# Patient Record
Sex: Male | Born: 1980 | Race: Black or African American | Hispanic: No | Marital: Single | Smoking: Current every day smoker
Health system: Southern US, Community
[De-identification: ages and names within clinical notes are randomized; demographics above are authoritative.]

## PROBLEM LIST (undated history)

## (undated) DIAGNOSIS — G43909 Migraine, unspecified, not intractable, without status migrainosus: Secondary | ICD-10-CM

---

## 2005-02-12 ENCOUNTER — Emergency Department (HOSPITAL_COMMUNITY): Admission: EM | Admit: 2005-02-12 | Discharge: 2005-02-12 | Payer: Self-pay | Admitting: Emergency Medicine

## 2005-03-03 ENCOUNTER — Emergency Department (HOSPITAL_COMMUNITY): Admission: EM | Admit: 2005-03-03 | Discharge: 2005-03-03 | Payer: Self-pay | Admitting: Emergency Medicine

## 2006-08-06 ENCOUNTER — Emergency Department (HOSPITAL_COMMUNITY): Admission: EM | Admit: 2006-08-06 | Discharge: 2006-08-06 | Payer: Self-pay | Admitting: Family Medicine

## 2006-10-23 ENCOUNTER — Emergency Department (HOSPITAL_COMMUNITY): Admission: EM | Admit: 2006-10-23 | Discharge: 2006-10-23 | Payer: Self-pay | Admitting: Emergency Medicine

## 2007-10-22 ENCOUNTER — Emergency Department (HOSPITAL_COMMUNITY): Admission: EM | Admit: 2007-10-22 | Discharge: 2007-10-22 | Payer: Self-pay | Admitting: Emergency Medicine
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
## 2018-10-03 ENCOUNTER — Other Ambulatory Visit: Payer: Self-pay

## 2018-10-03 ENCOUNTER — Ambulatory Visit (HOSPITAL_COMMUNITY)
Admission: EM | Admit: 2018-10-03 | Discharge: 2018-10-03 | Disposition: A | Discharge status: Home / Self Care | Payer: Self-pay | Attending: Family Medicine | Admitting: Family Medicine

## 2018-10-03 ENCOUNTER — Encounter (HOSPITAL_COMMUNITY): Payer: Self-pay

## 2018-10-03 DIAGNOSIS — Z7189 Other specified counseling: Secondary | ICD-10-CM

## 2018-10-03 DIAGNOSIS — Z0289 Encounter for other administrative examinations: Secondary | ICD-10-CM

## 2018-10-03 NOTE — ED Triage Notes (Signed)
Pt would like a work note. 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 Pt denies any symptoms and would like a work note.

## 2018-10-03 NOTE — ED Provider Notes (Signed)
[facility]    CSN: [id number] Arrival date & time: 10/03/18  [id number]     History   Chief Complaint Chief Complaint  Patient presents with  . Work note    HPI Nathaniel Bell is a 38 y.o. male.   HPI Patient is here for a physical examination and clearance to work. He states that he works at [employer] company where many of his coworkers are friends.  Over the weekend he had his friends got together for a "get together".  He states "we tried to social distance".  On Monday the house to the party became sick and was unable to go to work.  When she reported her symptoms to her employer, and who she was exposed to, the employer made everyone who went to the party go home.  This gentleman states that he feels well.  At no point has he had cough cold runny nose sore throat or fever.  He states that every day he goes to work they check his temperature, and he wears a mask throughout his shift History reviewed. No pertinent past medical history.  There are no active problems to display for this patient.   History reviewed. No pertinent surgical history.     Home Medications    Prior to Admission medications   Not on File    Family History History reviewed. No pertinent family history.  Social History Social History   Tobacco Use  . Smoking status: Current Every Day Smoker  . Smokeless tobacco: Current User  Substance Use Topics  . Alcohol use: Not Currently  . Drug use: Not Currently     Allergies   Patient has no known allergies.   Review of Systems Review of Systems  Constitutional: Negative for chills and fever.  HENT: Negative for ear pain and sore throat.   Eyes: Negative for pain and visual disturbance.  Respiratory: Negative for cough and shortness of breath.   Cardiovascular: Negative for chest pain and palpitations.  Gastrointestinal: Negative for abdominal pain and vomiting.  Genitourinary: Negative for dysuria and hematuria.  Musculoskeletal: Negative  for arthralgias and back pain.  Skin: Negative for color change and rash.  Neurological: Negative for seizures and syncope.  All other systems reviewed and are negative.    Physical Exam Triage Vital Signs ED Triage Vitals  Enc Vitals Group     BP 10/03/18 0939 129/76     Pulse Rate 10/03/18 0939 70     Resp 10/03/18 0939 18     Temp 10/03/18 0939 98.4 F (36.9 C)     Temp Source 10/03/18 0939 Oral     SpO2 10/03/18 0939 100 %   No data found.  Updated Vital Signs BP 129/76 (BP Location: Left Arm)   Pulse 70   Temp 98.4 F (36.9 C) (Oral)   Resp 18   SpO2 100%      Physical Exam Constitutional:      General: He is not in acute distress.    Appearance: He is well-developed.  HENT:     Head: Normocephalic and atraumatic.     Right Ear: Tympanic membrane, ear canal and external ear normal.     Left Ear: Tympanic membrane, ear canal and external ear normal.     Nose: Nose normal. No congestion.     Mouth/Throat:     Mouth: Mucous membranes are moist.     Pharynx: No posterior oropharyngeal erythema.  Eyes:     Conjunctiva/sclera: Conjunctivae normal.  Pupils: Pupils are equal, round, and reactive to light.  Neck:     Musculoskeletal: Normal range of motion.  Cardiovascular:     Rate and Rhythm: Normal rate and regular rhythm.     Heart sounds: Normal heart sounds.  Pulmonary:     Effort: Pulmonary effort is normal. No respiratory distress.     Breath sounds: Normal breath sounds.  Abdominal:     General: Abdomen is flat. There is no distension.     Palpations: Abdomen is soft.     Tenderness: There is no abdominal tenderness.  Musculoskeletal: Normal range of motion.  Skin:    General: Skin is warm and dry.  Neurological:     General: No focal deficit present.     Mental Status: He is alert.  Psychiatric:        Mood and Affect: Mood normal.        Behavior: Behavior normal.      UC Treatments / Results  Labs (all labs ordered are listed, but  only abnormal results are displayed) Labs Reviewed - No data to display  EKG None  Radiology No results found.  Procedures Procedures (including critical care time)  Medications Ordered in UC Medications - No data to display  Initial Impression / Assessment and Plan / UC Course  I have reviewed the triage vital signs and the nursing notes.  Pertinent labs & imaging results that were available during my care of the patient were reviewed by me and considered in my medical decision making (see chart for details).     We discussed that get-togethers and parties not advisable at all during the Covid 19 pandemic.  This is the reason he was sent home from work.  We discussed that he needs to check his temperature daily and watch carefully for symptoms.  At the first sign of any illness he needs to be released from work.  If he thinks he has any sign of call that he needs to call the hotline instead of coming into a physician. Final Clinical Impressions(s) / UC Diagnoses   Final diagnoses:  Advice Given About Covid-19 Virus Infection  Encounter to obtain excuse from work     Discharge Instructions     Check temperature daily Watch for signs of infection, cough, fever  Call if you think you have any sign of COVID [place] COVID19 hotline : [phone]      ED Prescriptions    None     Controlled Substance Prescriptions [place] Controlled Substance Registry consulted? Not Applicable   Eustace Moore, MD 10/03/18 1019

## 2018-10-03 NOTE — Discharge Instructions (Addendum)
Check temperature daily Watch for signs of infection, cough, fever  Call if you think you have any sign of COVID [place] COVID19 hotline : [phone]

## 2021-11-20 ENCOUNTER — Other Ambulatory Visit: Payer: Self-pay

## 2021-11-20 ENCOUNTER — Ambulatory Visit (HOSPITAL_COMMUNITY)
Admission: EM | Admit: 2021-11-20 | Discharge: 2021-11-20 | Disposition: A | Discharge status: Home / Self Care | Payer: Self-pay | Attending: Emergency Medicine | Admitting: Emergency Medicine

## 2021-11-20 ENCOUNTER — Encounter (HOSPITAL_COMMUNITY): Payer: Self-pay | Admitting: *Deleted

## 2021-11-20 DIAGNOSIS — Z711 Person with feared health complaint in whom no diagnosis is made: Secondary | ICD-10-CM | POA: Insufficient documentation

## 2021-11-20 DIAGNOSIS — Z202 Contact with and (suspected) exposure to infections with a predominantly sexual mode of transmission: Secondary | ICD-10-CM

## 2021-11-20 NOTE — Discharge Instructions (Addendum)
Since you are asymptomatic we will wait for the test result return you will be called with the results in a few days.  Return sooner if worse or new symptoms for reevaluation.

## 2021-11-20 NOTE — ED Provider Notes (Signed)
  [facility]   MRN: [id number] DOB: 1980-09-03  Subjective:   Chief Complaint;  Chief Complaint  Patient presents with   Exposure to STD    Toan Mort is a 41 y.o. male presenting for concern of possible STD.  Patient had unprotected intercourse with a male this past weekend.  He denies that she complained of symptoms and patient is also asymptomatic but is concerned enough to want testing.  No current facility-administered medications for this encounter. No current outpatient medications on file.   No Known Allergies  History reviewed. No pertinent past medical history.   Review of Systems  All other systems reviewed and are negative.   Objective:   Vitals: BP 123/78   Pulse 68   Temp 98.6 F (37 C)   Resp 18   SpO2 98%   Physical Exam Vitals and nursing note reviewed.  Constitutional:      General: He is not in acute distress.    Appearance: He is well-developed.  HENT:     Head: Normocephalic and atraumatic.  Eyes:     Conjunctiva/sclera: Conjunctivae normal.  Cardiovascular:     Rate and Rhythm: Normal rate and regular rhythm.     Heart sounds: No murmur heard. Pulmonary:     Effort: Pulmonary effort is normal. No respiratory distress.     Breath sounds: Normal breath sounds.  Abdominal:     Palpations: Abdomen is soft.     Tenderness: There is no abdominal tenderness.  Musculoskeletal:        General: No swelling.     Cervical back: Neck supple.  Skin:    General: Skin is warm and dry.     Capillary Refill: Capillary refill takes less than 2 seconds.  Neurological:     Mental Status: He is alert.  Psychiatric:        Mood and Affect: Mood normal.    No results found for this or any previous visit (from the past 24 hour(s)).  No results found.     Assessment and Plan :   1. Concern about STD in male without diagnosis       MDM:  Colbi Staubs is a 41 y.o. male presenting for testing for STDs urethral swabs done for  chlamydia, gonorrhea and trichomoniasis.  Patient assured he will be called with the results.  I discussed treatment, follow up and return instructions. Questions were answered. Patient/representative stated understanding of instructions and patient is stable for discharge.  Dewaine Conger FNP-C MCN    Jone Baseman, NP 11/20/21 1109

## 2021-11-20 NOTE — ED Triage Notes (Signed)
T reports he received a call from sexual partner that she was Positive for STD . Pt has no Sx's

## 2021-11-22 LAB — CYTOLOGY, (ORAL, ANAL, URETHRAL) ANCILLARY ONLY
Chlamydia: NEGATIVE
Comment: NEGATIVE
Comment: NEGATIVE
Comment: NORMAL
Neisseria Gonorrhea: NEGATIVE
Trichomonas: POSITIVE — AB

## 2021-11-23 ENCOUNTER — Telehealth (HOSPITAL_COMMUNITY): Payer: Self-pay | Admitting: Emergency Medicine

## 2021-11-23 MED ORDER — METRONIDAZOLE 500 MG PO TABS: 2000.0000 mg | ORAL_TABLET | Freq: Once | ORAL | 0 refills | Status: AC

## 2022-06-11 ENCOUNTER — Telehealth: Payer: Self-pay | Admitting: Family

## 2022-06-11 DIAGNOSIS — R369 Urethral discharge, unspecified: Secondary | ICD-10-CM

## 2022-06-11 NOTE — Progress Notes (Signed)
Given your symptoms, I feel your condition warrants further evaluation and I recommend that you be seen in a face to face visit.   NOTE: There will be NO CHARGE for this eVisit   If you are having a true medical emergency please call 911.      For an urgent face to face visit, [facility] has seven urgent care centers for your convenience:     [facility] at [facility] Directions [phone] [address] [place], [place] [address]    [facility]) Get Driving Directions [phone] [address] [place], [place] [address]  [facility]) Get Driving Directions [phone] [address] [place],  [place]  [address]  [facility] - at [facility] Directions  [phone] [address] [place],  [place] [address]   [facility] at [facility] Get Driving Directions [phone] [address] [place], [place] [address]   [facility] at [facility] -  Get Driving Directions  [phone] [address] [place], [place] [address]   [facility] at [facility] Directions [phone] [address] [place], [place] [address]  Your MyChart E-visit questionnaire answers were reviewed by a board certified advanced clinical practitioner to complete your personal care plan based on your specific symptoms.  Thank you for using e-Visits.

## 2023-07-24 ENCOUNTER — Ambulatory Visit (HOSPITAL_COMMUNITY)
Admission: EM | Admit: 2023-07-24 | Discharge: 2023-07-24 | Disposition: A | Discharge status: Home / Self Care | Payer: BC Managed Care – PPO | Attending: Emergency Medicine | Admitting: Emergency Medicine

## 2023-07-24 ENCOUNTER — Encounter (HOSPITAL_COMMUNITY): Payer: Self-pay

## 2023-07-24 DIAGNOSIS — R519 Headache, unspecified: Secondary | ICD-10-CM | POA: Diagnosis not present

## 2023-07-24 HISTORY — DX: Migraine, unspecified, not intractable, without status migrainosus: G43.909

## 2023-07-24 MED ORDER — DEXAMETHASONE SODIUM PHOSPHATE 10 MG/ML IJ SOLN
INTRAMUSCULAR | Status: AC
  Filled 2023-07-24: qty 1

## 2023-07-24 MED ORDER — ONDANSETRON 4 MG PO TBDP
ORAL_TABLET | ORAL | Status: AC
  Filled 2023-07-24: qty 1

## 2023-07-24 MED ORDER — KETOROLAC TROMETHAMINE 30 MG/ML IJ SOLN
30.0000 mg | Freq: Once | INTRAMUSCULAR | Status: AC
  Administered 2023-07-24: 30 mg via INTRAMUSCULAR

## 2023-07-24 MED ORDER — DEXAMETHASONE SODIUM PHOSPHATE 10 MG/ML IJ SOLN
10.0000 mg | Freq: Once | INTRAMUSCULAR | Status: AC
  Administered 2023-07-24: 10 mg via INTRAMUSCULAR

## 2023-07-24 MED ORDER — KETOROLAC TROMETHAMINE 30 MG/ML IJ SOLN
INTRAMUSCULAR | Status: AC
  Filled 2023-07-24: qty 1

## 2023-07-24 MED ORDER — ONDANSETRON 4 MG PO TBDP
4.0000 mg | ORAL_TABLET | Freq: Once | ORAL | Status: AC
  Administered 2023-07-24: 4 mg via ORAL

## 2023-07-24 NOTE — Discharge Instructions (Addendum)
You were given a series of 3 medications today to help with your migraine. Alternate between Tylenol and Ibuprofen every 4-6 hours as needed for headache.Make sure to stay hydrated and get some rest. If your headache persists or worsens, you develop severe vomiting, blurred vision, numbness, weakness, slurred speech, or confusion please seek immediate medical treatment in the ER.

## 2023-07-24 NOTE — ED Triage Notes (Signed)
Pt states frontal headache for the past 2 days.  States he is taking tylenol and goody powder at home with some relief.

## 2023-07-24 NOTE — ED Provider Notes (Signed)
[facility]    CSN: [id number] Arrival date & time: 07/24/23  1159      History   Chief Complaint Chief Complaint  Patient presents with   Headache    HPI Nathaniel Bell is a 43 y.o. male.   Patient presents with headache with mild nausea x3 days. Patient also endorses photophobia. Denies blurred vision, vomiting, weakness, numbness, confusion, and slurred speech. Reports taking goody powder and Tylenol with minimal relief.     Headache Associated symptoms: nausea and photophobia   Associated symptoms: no dizziness, no fever, no numbness, no vomiting and no weakness     Past Medical History:  Diagnosis Date   Migraines     There are no active problems to display for this patient.   History reviewed. No pertinent surgical history.     Home Medications    Prior to Admission medications   Not on File    Family History History reviewed. No pertinent family history.  Social History Social History   Tobacco Use   Smoking status: Every Day   Smokeless tobacco: Current  Substance Use Topics   Alcohol use: Not Currently   Drug use: Not Currently     Allergies   Patient has no known allergies.   Review of Systems Review of Systems  Constitutional:  Negative for fever.  Eyes:  Positive for photophobia. Negative for visual disturbance.  Gastrointestinal:  Positive for nausea. Negative for vomiting.  Neurological:  Positive for headaches. Negative for dizziness, facial asymmetry, speech difficulty, weakness, light-headedness and numbness.     Physical Exam Triage Vital Signs ED Triage Vitals  Encounter Vitals Group     BP 07/24/23 1329 137/74     Systolic BP Percentile --      Diastolic BP Percentile --      Pulse Rate 07/24/23 1329 98     Resp 07/24/23 1329 18     Temp 07/24/23 1329 97.7 F (36.5 C)     Temp Source 07/24/23 1329 Oral     SpO2 07/24/23 1329 97 %     Weight 07/24/23 1330 160 lb (72.6 kg)     Height 07/24/23 1330 6\' 1"   (1.854 m)     Head Circumference --      Peak Flow --      Pain Score 07/24/23 1329 10     Pain Loc --      Pain Education --      Exclude from Growth Chart --    No data found.  Updated Vital Signs BP 137/74 (BP Location: Left Arm)   Pulse 98   Temp 97.7 F (36.5 C) (Oral)   Resp 18   Ht 6\' 1"  (1.854 m)   Wt 160 lb (72.6 kg)   SpO2 97%   BMI 21.11 kg/m   Visual Acuity Right Eye Distance:   Left Eye Distance:   Bilateral Distance:    Right Eye Near:   Left Eye Near:    Bilateral Near:     Physical Exam Vitals and nursing note reviewed.  Constitutional:      General: He is awake. He is not in acute distress.    Appearance: Normal appearance. He is well-developed and well-groomed. He is not ill-appearing.  HENT:     Head: Normocephalic.  Eyes:     Extraocular Movements: Extraocular movements intact.     Pupils: Pupils are equal, round, and reactive to light.  Cardiovascular:     Rate and Rhythm: Normal rate  and regular rhythm.  Pulmonary:     Effort: Pulmonary effort is normal.     Breath sounds: Normal breath sounds.  Musculoskeletal:        General: Normal range of motion.     Cervical back: Normal range of motion and neck supple.  Skin:    General: Skin is warm and dry.  Neurological:     Mental Status: He is alert and oriented to person, place, and time. Mental status is at baseline.     GCS: GCS eye subscore is 4. GCS verbal subscore is 5. GCS motor subscore is 6.     Cranial Nerves: Cranial nerves 2-12 are intact.     Sensory: Sensation is intact.     Motor: Motor function is intact.     Coordination: Coordination is intact.     Gait: Gait is intact.  Psychiatric:        Attention and Perception: Attention and perception normal.        Mood and Affect: Mood and affect normal.        Speech: Speech normal.        Behavior: Behavior normal. Behavior is cooperative.        Thought Content: Thought content normal.        Cognition and Memory: Cognition  and memory normal.        Judgment: Judgment normal.      UC Treatments / Results  Labs (all labs ordered are listed, but only abnormal results are displayed) Labs Reviewed - No data to display  EKG   Radiology No results found.  Procedures Procedures (including critical care time)  Medications Ordered in UC Medications  ketorolac (TORADOL) 30 MG/ML injection 30 mg (has no administration in time range)  ondansetron (ZOFRAN-ODT) disintegrating tablet 4 mg (has no administration in time range)  dexamethasone (DECADRON) injection 10 mg (has no administration in time range)    Initial Impression / Assessment and Plan / UC Course  I have reviewed the triage vital signs and the nursing notes.  Pertinent labs & imaging results that were available during my care of the patient were reviewed by me and considered in my medical decision making (see chart for details).     Patient presented with 3-day history of headache with mild nausea and photophobia. Denies any other symptoms. Unrelieved with goody powder and Tylenol.   Patient rates headache 10 out of 10 on pain scale. No significant findings upon exam. No neurodeficits noted. EOMI and PERRLA. GCS 15.  Given IM Toradol, IM Decadron, and Zofran ODT in clinic. Discussed follow-up, return, strict ER precautions.  Final Clinical Impressions(s) / UC Diagnoses   Final diagnoses:  Bad headache     Discharge Instructions      You were given a series of 3 medications today to help with your migraine. Alternate between Tylenol and Ibuprofen every 4-6 hours as needed for headache.Make sure to stay hydrated and get some rest. If your headache persists or worsens, you develop severe vomiting, blurred vision, numbness, weakness, slurred speech, or confusion please seek immediate medical treatment in the ER.     ED Prescriptions   None    PDMP not reviewed this encounter.   Wynonia Lawman A, NP 07/24/23 1400

## 2024-05-29 ENCOUNTER — Encounter: Payer: Self-pay | Admitting: Podiatry

## 2024-05-29 ENCOUNTER — Ambulatory Visit

## 2024-05-29 ENCOUNTER — Ambulatory Visit: Payer: Self-pay | Admitting: Podiatry

## 2024-05-29 DIAGNOSIS — B070 Plantar wart: Secondary | ICD-10-CM | POA: Diagnosis not present

## 2024-05-29 DIAGNOSIS — M2042 Other hammer toe(s) (acquired), left foot: Secondary | ICD-10-CM | POA: Diagnosis not present

## 2024-05-29 DIAGNOSIS — L84 Corns and callosities: Secondary | ICD-10-CM | POA: Diagnosis not present

## 2024-05-29 NOTE — Progress Notes (Signed)
 Subjective:  Patient ID: Nathaniel Bell, male    DOB: 1980/12/29,  MRN: [id number]  Chief Complaint  Patient presents with   Callouses    Rm5 Painful callous left foot plantar/ painful maceration 4th IMS left foot. no treatment     Discussed the use of AI scribe software for clinical note transcription with the patient, who gave verbal consent to proceed.  History of Present Illness Nathaniel Bell is a 43 year old male who presents with painful calluses and sores on his feet.  He has developed a callus on the bottom of his foot and a sore between his toes, associated with itching and burning sensations, particularly when wearing tight shoes. He has applied Neosporin to the affected areas to alleviate symptoms.  The pain is exacerbated by wearing closed shoes, especially dress shoes, impacting his ability to wear certain types of footwear. A recent attempt to wear dress shoes to church prompted him to seek medical attention.  He notes that the shape of his feet is similar to that of his family members, suggesting a hereditary component.      Objective:    Physical Exam VASCULAR: DP and PT pulse palpable. Foot is warm and well-perfused. Capillary fill time is brisk. DERMATOLOGIC: Normal skin turgor, texture, and temperature. No open lesions, rashes, or ulcerations. NEUROLOGIC: Normal sensation to light touch and pressure. No paresthesias. ORTHOPEDIC: Smooth pain-free range of motion of all examined joints. No ecchymosis or bruising. No gross deformity. No pain to palpation. Submenopausal three in left foot, additional lesion on plantar second digit. Interdigital soft corn on left fourth inner space. Pes planus deformity, mild hallux valgus, hammer toe contractures.   No images are attached to the encounter.    Results RADIOLOGY Foot X-ray: Pes planus deformity, mild hallux valgus, hammer toe contractures (05/29/2024)  PROCEDURE NOTES Procedure: Interdigital corn  trimming Description: Trimmed the interdigital corn between the toes, removing dead skin.  Procedure: Plantar wart trimming and salicylic acid application Description: Trimmed the plantar wart and applied salicylic acid on a Band-Aid.   Assessment:   1. Pre-ulcerative corn or callous   2. Hammertoe of left foot   3. Verruca plantaris      Plan:  Patient was evaluated and treated and all questions answered.  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 Pain upon squeezing. Initial treatment with salicylic acid planned. If ineffective, cantharidin may be considered, though it may cause soreness. - Trimmed the wart and applied salicylic acid on a Band-Aid for a few days. - Scheduled follow-up in one month to assess response to treatment. - If salicylic acid is ineffective, will consider cantharidin treatment. - Advised use of Doctor Scholl's Maximum Strength Wart Remover pads at home.  Interdigital soft corn of left fourth interspace Interdigital soft corn in the left fourth interspace due to bone rubbing. No evidence of tinea pedis or ulceration. Conservative management with trimming and gel spacers. Surgical intervention considered if conservative measures fail. - Trimmed and shaved the corn. - Provided gel spacers and silicone pads to reduce pressure between toes. - Advised wearing shoes with extra wide width and square or round toe box. - Will consider surgical intervention if conservative measures fail.  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 Conservative management with orthotics if needed. Surgical intervention considered if conservative measures fail. -  Will consider orthotics if symptoms persist after skin healing. - Will evaluate need for surgical intervention if conservative measures  fail.      Return in about 1 month (around 06/29/2024) for wart treatment.

## 2024-05-29 NOTE — Patient Instructions (Addendum)
  VISIT SUMMARY: Today, you were seen for painful calluses and sores on your feet. You have a callus on the bottom of your foot and a sore between your toes, which cause itching and burning, especially when wearing tight shoes. We discussed your symptoms and developed a treatment plan to address these issues.  YOUR PLAN: -PLANTAR WARTS OF LEFT FOOT: Plantar warts are caused by the HPV virus and can be painful when squeezed. We trimmed the wart and applied salicylic acid on a Band-Aid for a few days. You should use Doctor Scholl's Maximum Strength Wart Remover pads at home. We will follow up in one month to see if the treatment is working. If not, we may consider using cantharidin, which can cause soreness.  -INTERDIGITAL SOFT CORN OF LEFT FOURTH INTERSPACE: An interdigital soft corn is a painful area of thickened skin between the toes caused by bone rubbing. We trimmed and shaved the corn and provided gel spacers and silicone pads to reduce pressure. You should wear shoes with extra wide width and a square or round toe box. If these measures do not help, we may consider surgery.  -PES PLANUS DEFORMITY WITH MILD HALLUX VALGUS AND HAMMER TOE CONTRACTURES: Pes planus, or flat feet, can lead to other foot issues like hallux valgus (a bunion) and hammer toe contractures. We will consider orthotics if your symptoms persist after your skin heals. If conservative measures do not help, we may evaluate the need for surgery.  INSTRUCTIONS: Please follow up in one month to assess the response to the treatment for your plantar warts. Continue using the Doctor Scholl's Maximum Strength Wart Remover pads as directed. Wear shoes with extra wide width and a square or round toe box to help with the interdigital soft corn. If your symptoms persist or worsen, we may need to consider surgical options.                      Contains text generated by Abridge.                                  Contains text generated by Abridge.

## 2024-07-03 ENCOUNTER — Ambulatory Visit: Admitting: Podiatry
